# Patient Record
Sex: Male | Born: 1950 | Race: White | Hispanic: No | Marital: Married | Smoking: Former smoker
Health system: Southern US, Community
[De-identification: ages and names within clinical notes are randomized; demographics above are authoritative.]

## PROBLEM LIST (undated history)

## (undated) DIAGNOSIS — M51369 Other intervertebral disc degeneration, lumbar region without mention of lumbar back pain or lower extremity pain: Secondary | ICD-10-CM

## (undated) DIAGNOSIS — M5136 Other intervertebral disc degeneration, lumbar region: Secondary | ICD-10-CM

## (undated) DIAGNOSIS — K449 Diaphragmatic hernia without obstruction or gangrene: Secondary | ICD-10-CM

## (undated) DIAGNOSIS — I10 Essential (primary) hypertension: Secondary | ICD-10-CM

## (undated) DIAGNOSIS — E119 Type 2 diabetes mellitus without complications: Secondary | ICD-10-CM

## (undated) HISTORY — PX: WISDOM TOOTH EXTRACTION: SHX21

## (undated) HISTORY — DX: Other intervertebral disc degeneration, lumbar region: M51.36

## (undated) HISTORY — DX: Other intervertebral disc degeneration, lumbar region without mention of lumbar back pain or lower extremity pain: M51.369

---

## 2006-01-16 ENCOUNTER — Ambulatory Visit: Payer: Self-pay | Admitting: General Surgery

## 2006-01-16 HISTORY — PX: COLONOSCOPY: SHX174

## 2011-04-22 ENCOUNTER — Ambulatory Visit: Payer: Self-pay | Admitting: Internal Medicine

## 2016-11-08 ENCOUNTER — Ambulatory Visit: Payer: Medicare Other

## 2016-11-08 ENCOUNTER — Ambulatory Visit
Admission: EM | Admit: 2016-11-08 | Discharge: 2016-11-08 | Disposition: A | Discharge status: Home / Self Care | Payer: Medicare Other | Attending: Family Medicine | Admitting: Family Medicine

## 2016-11-08 DIAGNOSIS — M546 Pain in thoracic spine: Secondary | ICD-10-CM | POA: Diagnosis not present

## 2016-11-08 DIAGNOSIS — S29019A Strain of muscle and tendon of unspecified wall of thorax, initial encounter: Secondary | ICD-10-CM | POA: Diagnosis not present

## 2016-11-08 DIAGNOSIS — S161XXA Strain of muscle, fascia and tendon at neck level, initial encounter: Secondary | ICD-10-CM | POA: Diagnosis not present

## 2016-11-08 HISTORY — DX: Diaphragmatic hernia without obstruction or gangrene: K44.9

## 2016-11-08 HISTORY — DX: Type 2 diabetes mellitus without complications: E11.9

## 2016-11-08 MED ORDER — NAPROXEN 500 MG PO TABS: 500.0000 mg | ORAL_TABLET | Freq: Two times a day (BID) | ORAL | 0 refills | Status: AC

## 2016-11-08 MED ORDER — METAXALONE 800 MG PO TABS: 800.0000 mg | ORAL_TABLET | Freq: Three times a day (TID) | ORAL | 0 refills | Status: DC

## 2016-11-08 MED ORDER — KETOROLAC TROMETHAMINE 60 MG/2ML IM SOLN
30.0000 mg | Freq: Once | INTRAMUSCULAR | Status: AC
  Administered 2016-11-08: 30 mg via INTRAMUSCULAR
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
## 2018-05-01 ENCOUNTER — Encounter: Payer: Self-pay | Admitting: *Deleted

## 2018-05-31 ENCOUNTER — Ambulatory Visit: Payer: Self-pay | Admitting: General Surgery

## 2018-06-14 ENCOUNTER — Ambulatory Visit (INDEPENDENT_AMBULATORY_CARE_PROVIDER_SITE_OTHER): Payer: Medicare Other | Admitting: General Surgery

## 2018-06-14 ENCOUNTER — Encounter: Payer: Self-pay | Admitting: General Surgery

## 2018-06-14 VITALS — BP 140/80 | HR 67 | Resp 13 | Ht 70.0 in | Wt 191.0 lb

## 2018-06-14 DIAGNOSIS — Z1211 Encounter for screening for malignant neoplasm of colon: Secondary | ICD-10-CM

## 2018-06-14 MED ORDER — POLYETHYLENE GLYCOL 3350 17 GM/SCOOP PO POWD: ORAL | 0 refills | Status: DC

## 2018-06-14 NOTE — Patient Instructions (Addendum)
The patient is aware to call back for any questions or concerns.  Colonoscopy, Adult A colonoscopy is an exam to look at the entire large intestine. During the exam, a lubricated, bendable tube is inserted into the anus and then passed into the rectum, colon, and other parts of the large intestine. A colonoscopy is often done as a part of normal colorectal screening or in response to certain symptoms, such as anemia, persistent diarrhea, abdominal pain, and blood in the stool. The exam can help screen for and diagnose medical problems, including:  Tumors.  Polyps.  Inflammation.  Areas of bleeding.  Tell a health care provider about:  Any allergies you have.  All medicines you are taking, including vitamins, herbs, eye drops, creams, and over-the-counter medicines.  Any problems you or family members have had with anesthetic medicines.  Any blood disorders you have.  Any surgeries you have had.  Any medical conditions you have.  Any problems you have had passing stool. What are the risks? Generally, this is a safe procedure. However, problems may occur, including:  Bleeding.  A tear in the intestine.  A reaction to medicines given during the exam.  Infection (rare).  What happens before the procedure? Eating and drinking restrictions Follow instructions from your health care provider about eating and drinking, which may include:  A few days before the procedure - follow a low-fiber diet. Avoid nuts, seeds, dried fruit, raw fruits, and vegetables.  1-3 days before the procedure - follow a clear liquid diet. Drink only clear liquids, such as clear broth or bouillon, black coffee or tea, clear juice, clear soft drinks or sports drinks, gelatin dessert, and popsicles. Avoid any liquids that contain red or purple dye.  On the day of the procedure - do not eat or drink anything during the 2 hours before the procedure, or within the time period that your health care provider  recommends.  Bowel prep If you were prescribed an oral bowel prep to clean out your colon:  Take it as told by your health care provider. Starting the day before your procedure, you will need to drink a large amount of medicated liquid. The liquid will cause you to have multiple loose stools until your stool is almost clear or light green.  If your skin or anus gets irritated from diarrhea, you may use these to relieve the irritation: ? Medicated wipes, such as adult wet wipes with aloe and vitamin E. ? A skin soothing-product like petroleum jelly.  If you vomit while drinking the bowel prep, take a break for up to 60 minutes and then begin the bowel prep again. If vomiting continues and you cannot take the bowel prep without vomiting, call your health care provider.  General instructions  Ask your health care provider about changing or stopping your regular medicines. This is especially important if you are taking diabetes medicines or blood thinners.  Plan to have someone take you home from the hospital or clinic. What happens during the procedure?  An IV tube may be inserted into one of your veins.  You will be given medicine to help you relax (sedative).  To reduce your risk of infection: ? Your health care team will wash or sanitize their hands. ? Your anal area will be washed with soap.  You will be asked to lie on your side with your knees bent.  Your health care provider will lubricate a long, thin, flexible tube. The tube will have a camera and   a light on the end.  The tube will be inserted into your anus.  The tube will be gently eased through your rectum and colon.  Air will be delivered into your colon to keep it open. You may feel some pressure or cramping.  The camera will be used to take images during the procedure.  A small tissue sample may be removed from your body to be examined under a microscope (biopsy). If any potential problems are found, the tissue  will be sent to a lab for testing.  If small polyps are found, your health care provider may remove them and have them checked for cancer cells.  The tube that was inserted into your anus will be slowly removed. The procedure may vary among health care providers and hospitals. What happens after the procedure?  Your blood pressure, heart rate, breathing rate, and blood oxygen level will be monitored until the medicines you were given have worn off.  Do not drive for 24 hours after the exam.  You may have a small amount of blood in your stool.  You may pass gas and have mild abdominal cramping or bloating due to the air that was used to inflate your colon during the exam.  It is up to you to get the results of your procedure. Ask your health care provider, or the department performing the procedure, when your results will be ready. This information is not intended to replace advice given to you by your health care provider. Make sure you discuss any questions you have with your health care provider. Document Released: 09/09/2000 Document Revised: 07/13/2016 Document Reviewed: 11/24/2015 Elsevier Interactive Patient Education  2018 Elsevier Inc.  

## 2018-06-14 NOTE — Progress Notes (Signed)
Patient ID: Benjamin Dunn, male   DOB: 1951/06/01, 67 y.o.   MRN: [id number]  Chief Complaint  Patient presents with  . Colonoscopy    HPI Benjamin Dunn is a 67 y.o. male.  Who presents for a colonoscopy discussion referred by Dr Arlana Pouch. The last colonoscopy was completed on 01-16-06. Denies any gastrointestinal issues. Bowels move regular and no bleeding noted. He would like a large abdominal bulge checked out that he has noticed over time. He works as a Product/process development scientist.  HPI  Past Medical History:  Diagnosis Date  . Degenerative disc disease, lumbar   . Diabetes mellitus without complication (HCC)   . Hiatal hernia     Past Surgical History:  Procedure Laterality Date  . COLONOSCOPY  01/16/2006   Dr Lemar Livings  . WISDOM TOOTH EXTRACTION      Family History  Problem Relation Age of Onset  . Diabetes Mother   . Heart attack Father   . Diabetes Father   . Mesothelioma Father   . Colon cancer Neg Hx     Social History Social History   Tobacco Use  . Smoking status: Former Smoker    Last attempt to quit: 11/08/1986    Years since quitting: 31.6  . Smokeless tobacco: Never Used  Substance Use Topics  . Alcohol use: Yes    Comment: occasionally  . Drug use: No    No Known Allergies  Current Outpatient Medications  Medication Sig Dispense Refill  . clonazePAM (KLONOPIN) 0.5 MG tablet Take 0.5 mg by mouth 2 (two) times daily as needed for anxiety.    Marland Kitchen co-enzyme Q-10 30 MG capsule Take 30 mg by mouth daily.    Marland Kitchen glimepiride (AMARYL) 4 MG tablet Take 4 mg by mouth daily with breakfast.    . HYDROcodone-acetaminophen (NORCO/VICODIN) 5-325 MG tablet Take 1 tablet by mouth every 6 (six) hours as needed for moderate pain.    . Insulin Glargine (BASAGLAR KWIKPEN) 100 UNIT/ML SOPN Inject 38 Units into the skin daily.    Marland Kitchen lisinopril (PRINIVIL,ZESTRIL) 2.5 MG tablet Take 2.5 mg by mouth daily.     . metFORMIN (GLUCOPHAGE) 1000 MG tablet Take 1,000 mg by mouth 2 (two)  times daily with a meal.    . methocarbamol (ROBAXIN) 500 MG tablet Take 500 mg by mouth every 6 (six) hours as needed.     . Multiple Vitamins-Minerals (CENTRUM SILVER PO) Take by mouth daily.    . naproxen (NAPROSYN) 500 MG tablet Take 1 tablet (500 mg total) by mouth 2 (two) times daily with a meal. (Patient taking differently: Take 500 mg by mouth daily. ) 60 tablet 0  . Omega-3 Fatty Acids (FISH OIL) 1000 MG CAPS Take by mouth daily.    Marland Kitchen omeprazole (PRILOSEC) 20 MG capsule Take 20 mg by mouth daily.  4  . rosuvastatin (CRESTOR) 20 MG tablet TAKE 1 TABLET BY MOUTH IN THE EVENING     No current facility-administered medications for this visit.     Review of Systems Review of Systems  Constitutional: Negative.   Respiratory: Negative.   Cardiovascular: Negative.   Gastrointestinal: Negative for constipation and diarrhea.    Blood pressure 140/80, pulse 67, resp. rate 13, height 5\' 10"  (1.778 m), weight 191 lb (86.6 kg).  Physical Exam Physical Exam  Constitutional: He is oriented to person, place, and time. He appears well-developed and well-nourished.  HENT:  Mouth/Throat: No oropharyngeal exudate.  Eyes: Conjunctivae are normal. No scleral icterus.  Neck:  Neck supple.  Cardiovascular: Normal rate, regular rhythm and normal heart sounds.  Pulmonary/Chest: Effort normal and breath sounds normal.  Abdominal: Soft. Bowel sounds are normal.    diastasis recti   Neurological: He is alert and oriented to person, place, and time.  Skin: Skin is warm and dry.  Psychiatric: His behavior is normal.    Data Reviewed 2007 colonoscopy reviewed.  Normal study.  Assessment    Candidate for screening colonoscopy.    Plan    Colonoscopy with possible biopsy/polypectomy prn: Information regarding the procedure, including its potential risks and complications (including but not limited to perforation of the bowel, which may require emergency surgery to repair, and bleeding) was  verbally given to the patient. Educational information regarding lower intestinal endoscopy was given to the patient. Written instructions for how to complete the bowel prep using Miralax were provided. The importance of drinking ample fluids to avoid dehydration as a result of the prep emphasized.  Plastic surgery referral for consideration of diastases repair offered, would defer until after colonoscopy is completed.    HPI, Physical Exam, Assessment and Plan have been scribed under the direction and in the presence of Earline MayotteJeffrey W. Byrnett, MD. Dorathy DaftMarsha Hatch, RN  I have completed the exam and reviewed the above documentation for accuracy and completeness.  I agree with the above.  Museum/gallery conserv[employer] has been used and any errors in dictation or transcription are unintentional.  Donnalee CurryJeffrey Byrnett, M.D., F.A.C.S.  Merrily PewJeffrey W Byrnett 06/14/2018, 9:49 AM  Patient has been scheduled for a [facility]. Miralax prescription has been sent in to the patient's pharmacy today. Colonoscopy instructions have been reviewed with the patient. Patient has been asked to make the following diabetic medications adjustments: hold metformin day of prep and procedure, continue glimepiride day of prep, decrease insulin by half night before prep and procedure. This patient has been asked to discontinue fish oil one week prior to procedure. This patient is aware to call the office if they have further questions.   Nicholes MangoMichele J. Bailey, CMA

## 2018-07-04 ENCOUNTER — Encounter: Payer: Self-pay | Admitting: *Deleted

## 2018-07-04 ENCOUNTER — Ambulatory Visit
Admission: RE | Admit: 2018-07-04 | Discharge: 2018-07-04 | Disposition: A | Discharge status: Home / Self Care | Payer: Medicare Other | Source: Ambulatory Visit | Attending: General Surgery | Admitting: General Surgery

## 2018-07-04 ENCOUNTER — Ambulatory Visit: Payer: Medicare Other | Admitting: Anesthesiology

## 2018-07-04 ENCOUNTER — Other Ambulatory Visit: Payer: Self-pay

## 2018-07-04 ENCOUNTER — Telehealth: Payer: Self-pay | Admitting: *Deleted

## 2018-07-04 ENCOUNTER — Ambulatory Visit: Payer: Medicare Other | Admitting: General Surgery

## 2018-07-04 ENCOUNTER — Other Ambulatory Visit: Payer: Self-pay | Admitting: General Surgery

## 2018-07-04 ENCOUNTER — Encounter
Admission: RE | Disposition: A | Discharge status: Home / Self Care | Payer: Self-pay | Source: Ambulatory Visit | Attending: General Surgery

## 2018-07-04 ENCOUNTER — Observation Stay
Admission: AD | Admit: 2018-07-04 | Discharge: 2018-07-05 | Disposition: A | Discharge status: Home / Self Care | Payer: Medicare Other | Source: Ambulatory Visit | Attending: General Surgery | Admitting: General Surgery

## 2018-07-04 DIAGNOSIS — R509 Fever, unspecified: Secondary | ICD-10-CM | POA: Diagnosis not present

## 2018-07-04 DIAGNOSIS — K5730 Diverticulosis of large intestine without perforation or abscess without bleeding: Secondary | ICD-10-CM | POA: Diagnosis not present

## 2018-07-04 DIAGNOSIS — R112 Nausea with vomiting, unspecified: Secondary | ICD-10-CM | POA: Insufficient documentation

## 2018-07-04 DIAGNOSIS — Z79899 Other long term (current) drug therapy: Secondary | ICD-10-CM | POA: Insufficient documentation

## 2018-07-04 DIAGNOSIS — Z1211 Encounter for screening for malignant neoplasm of colon: Principal | ICD-10-CM | POA: Insufficient documentation

## 2018-07-04 DIAGNOSIS — J920 Pleural plaque with presence of asbestos: Secondary | ICD-10-CM

## 2018-07-04 DIAGNOSIS — Z8249 Family history of ischemic heart disease and other diseases of the circulatory system: Secondary | ICD-10-CM | POA: Diagnosis not present

## 2018-07-04 DIAGNOSIS — Z87891 Personal history of nicotine dependence: Secondary | ICD-10-CM | POA: Insufficient documentation

## 2018-07-04 DIAGNOSIS — R079 Chest pain, unspecified: Secondary | ICD-10-CM | POA: Diagnosis not present

## 2018-07-04 DIAGNOSIS — I959 Hypotension, unspecified: Secondary | ICD-10-CM | POA: Diagnosis not present

## 2018-07-04 DIAGNOSIS — E1165 Type 2 diabetes mellitus with hyperglycemia: Secondary | ICD-10-CM | POA: Diagnosis not present

## 2018-07-04 DIAGNOSIS — M791 Myalgia, unspecified site: Secondary | ICD-10-CM | POA: Insufficient documentation

## 2018-07-04 DIAGNOSIS — D72829 Elevated white blood cell count, unspecified: Secondary | ICD-10-CM | POA: Diagnosis not present

## 2018-07-04 DIAGNOSIS — G8918 Other acute postprocedural pain: Secondary | ICD-10-CM

## 2018-07-04 DIAGNOSIS — M1990 Unspecified osteoarthritis, unspecified site: Secondary | ICD-10-CM | POA: Diagnosis not present

## 2018-07-04 DIAGNOSIS — M5136 Other intervertebral disc degeneration, lumbar region: Secondary | ICD-10-CM | POA: Insufficient documentation

## 2018-07-04 DIAGNOSIS — J9589 Other postprocedural complications and disorders of respiratory system, not elsewhere classified: Secondary | ICD-10-CM

## 2018-07-04 DIAGNOSIS — Z794 Long term (current) use of insulin: Secondary | ICD-10-CM | POA: Insufficient documentation

## 2018-07-04 HISTORY — PX: COLONOSCOPY WITH PROPOFOL: SHX5780

## 2018-07-04 LAB — CBC WITH DIFFERENTIAL/PLATELET
ABS IMMATURE GRANULOCYTES: 0.02 10*3/uL (ref 0.00–0.07)
Basophils Absolute: 0.1 10*3/uL (ref 0.0–0.1)
Basophils Relative: 1 %
Eosinophils Absolute: 0 10*3/uL (ref 0.0–0.5)
Eosinophils Relative: 0 %
HCT: 43.3 % (ref 39.0–52.0)
HEMOGLOBIN: 14.7 g/dL (ref 13.0–17.0)
Immature Granulocytes: 0 %
LYMPHS PCT: 5 %
Lymphs Abs: 0.6 10*3/uL — ABNORMAL LOW (ref 0.7–4.0)
MCH: 32.8 pg (ref 26.0–34.0)
MCHC: 33.9 g/dL (ref 30.0–36.0)
MCV: 96.7 fL (ref 80.0–100.0)
MONO ABS: 0.4 10*3/uL (ref 0.1–1.0)
MONOS PCT: 3 %
Neutro Abs: 10.1 10*3/uL — ABNORMAL HIGH (ref 1.7–7.7)
Neutrophils Relative %: 91 %
Platelets: 319 10*3/uL (ref 150–400)
RBC: 4.48 MIL/uL (ref 4.22–5.81)
RDW: 12.8 % (ref 11.5–15.5)
WBC: 11.2 10*3/uL — ABNORMAL HIGH (ref 4.0–10.5)
nRBC: 0 % (ref 0.0–0.2)

## 2018-07-04 LAB — BASIC METABOLIC PANEL
ANION GAP: 9 (ref 5–15)
BUN: 19 mg/dL (ref 8–23)
CALCIUM: 9.1 mg/dL (ref 8.9–10.3)
CO2: 24 mmol/L (ref 22–32)
Chloride: 104 mmol/L (ref 98–111)
Creatinine, Ser: 1.18 mg/dL (ref 0.61–1.24)
GFR calc Af Amer: >60 mL/min (ref >60)
Glucose, Bld: 213 mg/dL — ABNORMAL HIGH (ref 70–99)
POTASSIUM: 4.3 mmol/L (ref 3.5–5.1)
SODIUM: 137 mmol/L (ref 135–145)

## 2018-07-04 LAB — GLUCOSE, CAPILLARY
GLUCOSE-CAPILLARY: 136 mg/dL — AB (ref 70–99)
Glucose-Capillary: 218 mg/dL — ABNORMAL HIGH (ref 70–99)
Glucose-Capillary: 164 mg/dL — ABNORMAL HIGH (ref 70–99)

## 2018-07-04 SURGERY — COLONOSCOPY WITH PROPOFOL
Anesthesia: General

## 2018-07-04 MED ORDER — CLONAZEPAM 0.5 MG PO TABS
0.5000 mg | ORAL_TABLET | Freq: Every day | ORAL | Status: DC
  Administered 2018-07-04: 0.5 mg via ORAL
  Filled 2018-07-04: qty 1

## 2018-07-04 MED ORDER — INSULIN ASPART 100 UNIT/ML ~~LOC~~ SOLN
0.0000 [IU] | Freq: Three times a day (TID) | SUBCUTANEOUS | Status: DC
  Administered 2018-07-05 (×2): 2 [IU] via SUBCUTANEOUS
  Filled 2018-07-04 (×2): qty 1

## 2018-07-04 MED ORDER — LACTATED RINGERS IV SOLN: Freq: Once | INTRAVENOUS | Status: DC

## 2018-07-04 MED ORDER — LACTATED RINGERS IV SOLN
INTRAVENOUS | Status: DC
  Administered 2018-07-04 – 2018-07-05 (×3): via INTRAVENOUS

## 2018-07-04 MED ORDER — DEXTROSE IN LACTATED RINGERS 5 % IV SOLN: INTRAVENOUS | Status: DC

## 2018-07-04 MED ORDER — PANTOPRAZOLE SODIUM 40 MG PO TBEC
40.0000 mg | DELAYED_RELEASE_TABLET | Freq: Every day | ORAL | Status: DC
  Administered 2018-07-04: 40 mg via ORAL
  Filled 2018-07-04 (×2): qty 1

## 2018-07-04 MED ORDER — PROPOFOL 10 MG/ML IV BOLUS
INTRAVENOUS | Status: DC | PRN
  Administered 2018-07-04: 20 mg via INTRAVENOUS
  Administered 2018-07-04: 50 mg via INTRAVENOUS

## 2018-07-04 MED ORDER — SODIUM CHLORIDE 0.9 % IV SOLN: 4.0000 mg | INTRAVENOUS | Status: DC | PRN

## 2018-07-04 MED ORDER — PROPOFOL 500 MG/50ML IV EMUL
INTRAVENOUS | Status: AC
  Filled 2018-07-04: qty 50

## 2018-07-04 MED ORDER — SODIUM CHLORIDE 0.9 % IV SOLN
1.0000 g | Freq: Once | INTRAVENOUS | Status: AC
  Administered 2018-07-04: 1000 mg via INTRAVENOUS
  Filled 2018-07-04: qty 1

## 2018-07-04 MED ORDER — ERTAPENEM SODIUM 1 G IJ SOLR: 1.0000 g | Freq: Once | INTRAMUSCULAR | Status: DC

## 2018-07-04 MED ORDER — LIDOCAINE HCL (PF) 2 % IJ SOLN
INTRAMUSCULAR | Status: DC | PRN
  Administered 2018-07-04: 80 mg via INTRADERMAL

## 2018-07-04 MED ORDER — ACETAMINOPHEN 325 MG PO TABS
650.0000 mg | ORAL_TABLET | ORAL | Status: DC | PRN
  Administered 2018-07-04 – 2018-07-05 (×2): 650 mg via ORAL
  Filled 2018-07-04 (×2): qty 2

## 2018-07-04 MED ORDER — PROPOFOL 10 MG/ML IV BOLUS
INTRAVENOUS | Status: AC
  Filled 2018-07-04: qty 20

## 2018-07-04 MED ORDER — PROPOFOL 500 MG/50ML IV EMUL
INTRAVENOUS | Status: DC | PRN
  Administered 2018-07-04: 125 ug/kg/min via INTRAVENOUS

## 2018-07-04 MED ORDER — MORPHINE SULFATE (PF) 2 MG/ML IV SOLN: 2.0000 mg | INTRAVENOUS | Status: DC | PRN

## 2018-07-04 MED ORDER — MORPHINE SULFATE (PF) 4 MG/ML IV SOLN: 2.0000 mg | INTRAVENOUS | Status: DC | PRN

## 2018-07-04 MED ORDER — ACETAMINOPHEN 10 MG/ML IV SOLN
1000.0000 mg | Freq: Once | INTRAVENOUS | Status: AC
  Administered 2018-07-04: 1000 mg via INTRAVENOUS
  Filled 2018-07-04: qty 100

## 2018-07-04 MED ORDER — ONDANSETRON HCL 4 MG/2ML IJ SOLN
4.0000 mg | INTRAMUSCULAR | Status: DC | PRN
  Administered 2018-07-04: 4 mg via INTRAVENOUS
  Filled 2018-07-04: qty 2

## 2018-07-04 MED ORDER — SODIUM CHLORIDE 0.9 % IV SOLN
INTRAVENOUS | Status: DC
  Administered 2018-07-04: 10:00:00 via INTRAVENOUS

## 2018-07-04 NOTE — Anesthesia Preprocedure Evaluation (Addendum)
Anesthesia Evaluation  Patient identified by MRN, date of birth, ID band Patient awake    Reviewed: Allergy & Precautions, H&P , NPO status , Patient's Chart, lab work & pertinent test results  Airway Mallampati: I  TM Distance: >3 FB Neck ROM: full    Dental  (+) Teeth Intact   Pulmonary former smoker,    breath sounds clear to auscultation       Cardiovascular negative cardio ROS   Rhythm:regular Rate:Normal     Neuro/Psych negative neurological ROS  negative psych ROS   GI/Hepatic Neg liver ROS, hiatal hernia,   Endo/Other  diabetes  Renal/GU negative Renal ROS  negative genitourinary   Musculoskeletal  (+) Arthritis ,   Abdominal   Peds  Hematology negative hematology ROS (+)   Anesthesia Other Findings Past Medical History: No date: Degenerative disc disease, lumbar No date: Diabetes mellitus without complication (HCC) No date: Hiatal hernia  Past Surgical History: 01/16/2006: COLONOSCOPY     Comment:  Dr Lemar Livings No date: WISDOM TOOTH EXTRACTION     Reproductive/Obstetrics negative OB ROS                            Anesthesia Physical Anesthesia Plan  ASA: II  Anesthesia Plan: General   Post-op Pain Management:    Induction:   PONV Risk Score and Plan: Propofol infusion and TIVA  Airway Management Planned:   Additional Equipment:   Intra-op Plan:   Post-operative Plan:   Informed Consent: I have reviewed the patients History and Physical, chart, labs and discussed the procedure including the risks, benefits and alternatives for the proposed anesthesia with the patient or authorized representative who has indicated his/her understanding and acceptance.   Dental Advisory Given  Plan Discussed with: Anesthesiologist, CRNA and Surgeon  Anesthesia Plan Comments:         Anesthesia Quick Evaluation

## 2018-07-04 NOTE — Anesthesia Post-op Follow-up Note (Signed)
Anesthesia QCDR form completed.        

## 2018-07-04 NOTE — H&P (Signed)
No change in clinical history or exam.  Tolerated prep well.  For screening colonoscopy. 

## 2018-07-04 NOTE — Op Note (Signed)
[facility] Patient Name: Benjamin Dunn Procedure Date: 07/04/2018 10:41 AM MRN: [id number] Account #: [id number] Date of Birth: Apr 13, 1951 Admit Type: Outpatient Age: 67 Room: [facility] ENDO ROOM 1 Gender: Male Note Status: Finalized Procedure:            Colonoscopy Indications:          Screening for colorectal malignant neoplasm Providers:            Earline Mayotte, MD Referring MD:         Jillene Bucks. Arlana Pouch, MD (Referring MD) Medicines:            Monitored Anesthesia Care Complications:        No immediate complications. Procedure:            Pre-Anesthesia Assessment:                       - Prior to the procedure, a History and Physical was                        performed, and patient medications, allergies and                        sensitivities were reviewed. The patient's tolerance of                        previous anesthesia was reviewed.                       - The risks and benefits of the procedure and the                        sedation options and risks were discussed with the                        patient. All questions were answered and informed                        consent was obtained.                       After obtaining informed consent, the colonoscope was                        passed under direct vision. Throughout the procedure,                        the patient's blood pressure, pulse, and oxygen                        saturations were monitored continuously. The                        Colonoscope was introduced through the anus and                        advanced to the the cecum, identified by appendiceal                        orifice and ileocecal valve. The patient tolerated the  procedure well. The colonoscopy was somewhat difficult                        due to a tortuous colon. Successful completion of the                        procedure was aided by using manual pressure. The                 quality of the bowel preparation was excellent. Findings:      A single medium-mouthed diverticulum was found in the sigmoid colon.      The retroflexed view of the distal rectum and anal verge was normal and       showed no anal or rectal abnormalities. Impression:           - Diverticulosis in the sigmoid colon.                       - The distal rectum and anal verge are normal on                        retroflexion view.                       - No specimens collected. Recommendation:       - Repeat colonoscopy in 10 years for screening purposes. Procedure Code(s):    --- Professional ---                       [id number], Colonoscopy, flexible; diagnostic, including                        collection of specimen(s) by brushing or washing, when                        performed (separate procedure) Diagnosis Code(s):    --- Professional ---                       Z12.11, Encounter for screening for malignant neoplasm                        of colon                       K57.30, Diverticulosis of large intestine without                        perforation or abscess without bleeding CPT copyright 2018 American Medical Association. All rights reserved. The codes documented in this report are preliminary and upon coder review may  be revised to meet current compliance requirements. Earline Mayotte, MD 07/04/2018 11:24:44 AM This report has been signed electronically. Number of Addenda: 0 Note Initiated On: 07/04/2018 10:41 AM Scope Withdrawal Time: 0 hours 14 minutes 1 second  Total Procedure Duration: 0 hours 22 minutes 20 seconds       [facility]

## 2018-07-04 NOTE — Progress Notes (Signed)
Patient called reported chills, weakness 3 hours post colonoscopy.  Presented outpatient imaging for plain film and nearly collapsed. Admitted for management. Suspect perforation. (No biopsies completed during procedure).

## 2018-07-04 NOTE — Progress Notes (Signed)
Labs reviewed. Mild leukocytosis with left shift.  Will f/u in AM.  Lytes, renal function OK. Modest hyperglycemia.

## 2018-07-04 NOTE — H&P (Signed)
Benjamin Dunn is an 67 y.o. male.   Chief Complaint: Fever, chills, nausea and vomiting post colonoscopy HPI: This 67 year old male underwent a screening colonoscopy earlier today.  No biopsies were completed during the procedure and it was uneventful.  It did require full scope insertion and manual pressure in the sigmoid colon to negotiate the hepatic flexure.  The patient reported no symptoms at the time of discharge.  About 2-1/2 hours after leaving the hospital, after having lunch, he reported feeling slightly unwell with a chill followed by muscle aches.  Over the next hour or 2 this worsened and when he contacted the office we arrange for him to have plain films of the abdomen to look for occult perforation.  He was sent to the [facility] imaging center and became weak over there and required being helped to a chair by the technologist.  He is admitted now for further management.  Past Medical History:  Diagnosis Date  . Degenerative disc disease, lumbar   . Diabetes mellitus without complication (HCC)   . Hiatal hernia     Past Surgical History:  Procedure Laterality Date  . COLONOSCOPY  01/16/2006   Dr Lemar Livings  . WISDOM TOOTH EXTRACTION      Family History  Problem Relation Age of Onset  . Diabetes Mother   . Heart attack Father   . Diabetes Father   . Mesothelioma Father   . Colon cancer Neg Hx    Social History:  reports that he quit smoking about 31 years ago. He has never used smokeless tobacco. He reports that he drinks alcohol. He reports that he does not use drugs.  Allergies: No Known Allergies  Facility-Administered Medications Prior to Admission  Medication Dose Route Frequency Provider Last Rate Last Dose  . dextrose 5 % in lactated ringers infusion   Intravenous Continuous Osman Calzadilla, Merrily Pew, MD      . ertapenem [facility]) injection 1,000 mg  1 g Intravenous Once Josey Forcier, Merrily Pew, MD      . lactated ringers infusion   Intravenous Once Earline Mayotte,  MD      . morphine 4 MG/ML injection 2 mg  2 mg Intravenous Q2H PRN Earline Mayotte, MD      . ondansetron (ZOFRAN) 4 mg in sodium chloride 0.9 % 50 mL IVPB  4 mg Intravenous Q4H PRN Lemar Livings Merrily Pew, MD       Medications Prior to Admission  Medication Sig Dispense Refill  . clonazePAM (KLONOPIN) 0.5 MG tablet Take 0.5 mg by mouth 2 (two) times daily as needed for anxiety.    Marland Kitchen co-enzyme Q-10 30 MG capsule Take 30 mg by mouth daily.    Marland Kitchen glimepiride (AMARYL) 4 MG tablet Take 4 mg by mouth 2 (two) times daily.     Marland Kitchen HYDROcodone-acetaminophen (NORCO/VICODIN) 5-325 MG tablet Take 1 tablet by mouth every 6 (six) hours as needed for moderate pain.    . Insulin Glargine (BASAGLAR KWIKPEN) 100 UNIT/ML SOPN Inject 38 Units into the skin daily.    Marland Kitchen lisinopril (PRINIVIL,ZESTRIL) 2.5 MG tablet Take 2.5 mg by mouth daily.     . metFORMIN (GLUCOPHAGE) 1000 MG tablet Take 1,000 mg by mouth 2 (two) times daily with a meal.    . methocarbamol (ROBAXIN) 500 MG tablet Take 500 mg by mouth every 6 (six) hours as needed.     . Multiple Vitamins-Minerals (CENTRUM SILVER PO) Take by mouth daily.    . naproxen (NAPROSYN) 500 MG tablet Take  1 tablet (500 mg total) by mouth 2 (two) times daily with a meal. (Patient taking differently: Take 500 mg by mouth daily. ) 60 tablet 0  . Omega-3 Fatty Acids (FISH OIL) 1000 MG CAPS Take by mouth daily.    Marland Kitchen omeprazole (PRILOSEC) 20 MG capsule Take 20 mg by mouth daily.  4  . polyethylene glycol powder (GLYCOLAX/MIRALAX) powder 255 grams one bottle for colonoscopy prep 255 g 0  . rosuvastatin (CRESTOR) 20 MG tablet TAKE 1 TABLET BY MOUTH IN THE EVENING      Results for orders placed or performed during the hospital encounter of 07/04/18 (from the past 48 hour(s))  Glucose, capillary     Status: Abnormal   Collection Time: 07/04/18  5:20 PM  Result Value Ref Range   Glucose-Capillary 218 (H) 70 - 99 mg/dL   Comment 1 Notify RN    Dg Abd Acute W/chest  Result Date:  07/04/2018 CLINICAL DATA:  Chest pain, abdominal pain, nausea and vomiting after colonoscopy this morning. EXAM: DG ABDOMEN ACUTE W/ 1V CHEST COMPARISON:  None. FINDINGS: Normal heart size. Normal mediastinal contour. No pneumothorax. No pleural effusion. A few vague scattered nodular densities over the upper lungs bilaterally. Curvilinear calcifications overlie the left mid lung. Mild left basilar scarring versus atelectasis. No pulmonary edema. Relative absence of small bowel gas. Mild gas scattered throughout large bowel. No convincing pneumatosis or pneumoperitoneum. No radiopaque nephrolithiasis. Mild-to-moderate thoracolumbar spondylosis. IMPRESSION: 1. A few vague scattered nodular densities over the upper lungs bilaterally with curvilinear calcifications overlying the left mid lung. Differential includes pleural plaques and/or pulmonary nodules. Chest CT recommended for further characterization. 2. Mild left basilar scarring versus atelectasis. 3. Paucity of small bowel gas. Mild colonic gas. No convincing pneumatosis or pneumoperitoneum. Electronically Signed   By: Delbert Phenix M.D.   On: 07/04/2018 17:11  These films were independently reviewed.  I do not see evidence of free air.  ROS  Blood pressure 137/63, pulse (!) 101, temperature (!) 101.2 F (38.4 C), temperature source Oral, resp. rate 16, SpO2 97 %. Physical Exam  Constitutional: He is oriented to person, place, and time. He appears well-developed and well-nourished.  Eyes: Pupils are equal, round, and reactive to light.  Neck: Neck supple. No thyromegaly present.  Cardiovascular: Normal rate and regular rhythm.  Respiratory: Effort normal and breath sounds normal.  GI: Soft. Bowel sounds are normal. He exhibits distension (mild distension and tympany consistent w/ recent endoscopy. ).  Musculoskeletal: Normal range of motion.  Neurological: He is alert and oriented to person, place, and time.  Skin: Skin is warm and dry.   Psychiatric: He has a normal mood and affect. His behavior is normal. Judgment and thought content normal.     Assessment/Plan Possible microperforation versus transient bacteremia.  The patient will be given a dose of Invanz, receive IV fluids, medications for nausea and pain.  Clinical exam at this time does not warrant additional intervention.  Lung findings noted on the chest component of his abdominal series will be addressed with an outpatient CT in the future.  Earline Mayotte, MD 07/04/2018, 5:56 PM

## 2018-07-04 NOTE — Telephone Encounter (Signed)
He is post colonoscopy this am. Phone call from wife states he is having chills, cool and clammy, that started one hour ago. When he takes a deep breath his chest hurts. Body aches all over and is pale. Dr Lemar Livings aware and pt to have erect abd/kub at [facility] imaging and then come to his office, pt agrees.

## 2018-07-04 NOTE — Anesthesia Postprocedure Evaluation (Signed)
Anesthesia Post Note  Patient: Benjamin Dunn  Procedure(s) Performed: COLONOSCOPY WITH PROPOFOL (N/A )  Patient location during evaluation: PACU Anesthesia Type: General Level of consciousness: awake and alert Pain management: pain level controlled Vital Signs Assessment: post-procedure vital signs reviewed and stable Respiratory status: spontaneous breathing, nonlabored ventilation and respiratory function stable Cardiovascular status: blood pressure returned to baseline and stable Postop Assessment: no apparent nausea or vomiting Anesthetic complications: no     Last Vitals:  Vitals:   07/04/18 1145 07/04/18 1155  BP: (!) 148/76 (!) 142/77  Pulse:    Resp:    Temp:    SpO2:      Last Pain:  Vitals:   07/04/18 1155  TempSrc:   PainSc: 0-No pain                 Jovita Gamma

## 2018-07-04 NOTE — Transfer of Care (Signed)
Immediate Anesthesia Transfer of Care Note  Patient: Benjamin Dunn  Procedure(s) Performed: COLONOSCOPY WITH PROPOFOL (N/A )  Patient Location: PACU  Anesthesia Type:General  Level of Consciousness: sedated  Airway & Oxygen Therapy: Patient Spontanous Breathing and Patient connected to nasal cannula oxygen  Post-op Assessment: Report given to RN and Post -op Vital signs reviewed and stable  Post vital signs: Reviewed and stable  Last Vitals:  Vitals Value Taken Time  BP 118/75 07/04/2018 11:26 AM  Temp 36.4 C 07/04/2018 11:25 AM  Pulse 71 07/04/2018 11:26 AM  Resp 15 07/04/2018 11:26 AM  SpO2 97 % 07/04/2018 11:26 AM  Vitals shown include unvalidated device data.  Last Pain:  Vitals:   07/04/18 1125  TempSrc: Tympanic  PainSc: Asleep         Complications: No apparent anesthesia complications

## 2018-07-05 ENCOUNTER — Observation Stay: Payer: Medicare Other

## 2018-07-05 DIAGNOSIS — Z1211 Encounter for screening for malignant neoplasm of colon: Secondary | ICD-10-CM | POA: Diagnosis not present

## 2018-07-05 DIAGNOSIS — J920 Pleural plaque with presence of asbestos: Secondary | ICD-10-CM | POA: Diagnosis not present

## 2018-07-05 LAB — CBC WITH DIFFERENTIAL/PLATELET
Abs Immature Granulocytes: 0.04 10*3/uL (ref 0.00–0.07)
Basophils Absolute: 0.1 10*3/uL (ref 0.0–0.1)
Basophils Relative: 0 %
EOS ABS: 0 10*3/uL (ref 0.0–0.5)
EOS PCT: 0 %
HCT: 39.7 % (ref 39.0–52.0)
Hemoglobin: 13 g/dL (ref 13.0–17.0)
Immature Granulocytes: 0 %
Lymphocytes Relative: 10 %
Lymphs Abs: 1.5 10*3/uL (ref 0.7–4.0)
MCH: 31.7 pg (ref 26.0–34.0)
MCHC: 32.7 g/dL (ref 30.0–36.0)
MCV: 96.8 fL (ref 80.0–100.0)
MONO ABS: 0.7 10*3/uL (ref 0.1–1.0)
MONOS PCT: 5 %
NEUTROS PCT: 85 %
Neutro Abs: 11.8 10*3/uL — ABNORMAL HIGH (ref 1.7–7.7)
PLATELETS: 271 10*3/uL (ref 150–400)
RBC: 4.1 MIL/uL — ABNORMAL LOW (ref 4.22–5.81)
RDW: 12.7 % (ref 11.5–15.5)
WBC: 14.1 10*3/uL — ABNORMAL HIGH (ref 4.0–10.5)
nRBC: 0 % (ref 0.0–0.2)

## 2018-07-05 LAB — GLUCOSE, CAPILLARY
Glucose-Capillary: 150 mg/dL — ABNORMAL HIGH (ref 70–99)
Glucose-Capillary: 128 mg/dL — ABNORMAL HIGH (ref 70–99)

## 2018-07-05 LAB — HIV ANTIBODY (ROUTINE TESTING W REFLEX): HIV SCREEN 4TH GENERATION: NONREACTIVE

## 2018-07-05 MED ORDER — SODIUM CHLORIDE 0.9 % IV SOLN
1.0000 g | INTRAVENOUS | Status: DC
  Administered 2018-07-05: 1000 mg via INTRAVENOUS
  Filled 2018-07-05 (×2): qty 1

## 2018-07-05 MED ORDER — IOPAMIDOL (ISOVUE-300) INJECTION 61%
100.0000 mL | Freq: Once | INTRAVENOUS | Status: AC | PRN
  Administered 2018-07-05: 100 mL via INTRAVENOUS

## 2018-07-05 MED ORDER — IOPAMIDOL (ISOVUE-300) INJECTION 61%
15.0000 mL | INTRAVENOUS | Status: AC
  Administered 2018-07-05 (×2): 15 mL via ORAL

## 2018-07-05 NOTE — Care Management Obs Status (Signed)
MEDICARE OBSERVATION STATUS NOTIFICATION   Patient Details  Name: Benjamin Dunn MRN: [id number] Date of Birth: 08-Feb-1951   Medicare Observation Status Notification Given:  No(admitted obs less than 24 hours)    Chapman Fitch, RN 07/05/2018, 4:48 PM

## 2018-07-05 NOTE — Progress Notes (Signed)
Patient ID: Benjamin Dunn, male   DOB: Aug 22, 1951, 67 y.o.   MRN: [id number]  No chief complaint on file.   Referred By Dr. Donnalee Curry Reason for Referral pleural plaques  HPI Location, Quality, Duration, Severity, Timing, Context, Modifying Factors, Associated Signs and Symptoms.  Benjamin Dunn is a 67 y.o. male.  He was in his usual state of health until a routine colonoscopy prompted admission to the hospital for evaluation of an occult perforation.  He is a 67 year old gentleman who underwent a routine colonoscopy and did well with that.  Several hours after his colonoscopy he began to experience some abdominal pain and during a abdominal plain film series as an outpatient he experienced some syncopal episode and was admitted to the hospital for further evaluation.  A chest x-ray as part of the abdominal series reveals some pleural plaques and a subsequent CT scan of the chest was performed.  The CT scan of the chest has been independently reviewed by me.  There are no prior films for comparison.  The patient worked in [employer] for many years.  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  He also had a father who worked in the Chief of [employer] in [employer] and had a history of mesothelioma.  He ultimately died of mesothelioma with patient was a young child.  With respect to any chest symptoms he denies any recent chest wall pain.  He denies any fevers or chills.  He denies any cough or hemoptysis.  He does endorse a 5 pound weight loss but that is the only symptom he has had.  He states that he is always wondered if he will develop mesothelioma since his father had mesothelioma and he was exposed to asbestos.  He has a remote smoking history having quit approximately 30 years ago.   Past Medical History:  Diagnosis Date  . Degenerative disc disease, lumbar   .  Diabetes mellitus without complication (HCC)   . Hiatal hernia     Past Surgical History:  Procedure Laterality Date  . COLONOSCOPY  01/16/2006   Dr Lemar Livings  . WISDOM TOOTH EXTRACTION      Family History  Problem Relation Age of Onset  . Diabetes Mother   . Heart attack Father   . Diabetes Father   . Mesothelioma Father   . Colon cancer Neg Hx     Social History Social History   Tobacco Use  . Smoking status: Former Smoker    Last attempt to quit: 11/08/1986    Years since quitting: 31.6  . Smokeless tobacco: Never Used  Substance Use Topics  . Alcohol use: Yes    Comment: occasionally  . Drug use: No    No Known Allergies  Current Facility-Administered Medications  Medication Dose Route Frequency Provider Last Rate Last Dose  . acetaminophen (TYLENOL) tablet 650 mg  650 mg Oral Q4H PRN Earline Mayotte, MD   650 mg at 07/05/18 1027  . clonazePAM (KLONOPIN) tablet 0.5 mg  0.5 mg Oral QHS Earline Mayotte, MD   0.5 mg at 07/04/18 2321  . ertapenem (INVANZ) 1,000 mg in sodium chloride 0.9 % 100 mL IVPB  1 g Intravenous Q24H Earline Mayotte, MD 200 mL/hr at 07/05/18 1222 1,000 mg at 07/05/18 1222  . insulin aspart (novoLOG) injection 0-15 Units  0-15 Units Subcutaneous TID WC Byrnett, Merrily Pew, MD  2 Units at 07/05/18 1223  . lactated ringers infusion   Intravenous Continuous Earline Mayotte, MD 125 mL/hr at 07/05/18 1220    . morphine 2 MG/ML injection 2 mg  2 mg Intravenous Q2H PRN Earline Mayotte, MD      . ondansetron [facility]) injection 4 mg  4 mg Intravenous Q4H PRN Earline Mayotte, MD   4 mg at 07/04/18 1850  . pantoprazole (PROTONIX) EC tablet 40 mg  40 mg Oral Daily Earline Mayotte, MD   40 mg at 07/04/18 1849      Review of Systems A complete review of systems was asked and was negative except for the following positive findings 5 pound weight loss  Blood pressure 117/64, pulse 70, temperature 98.3 F (36.8 C), temperature source Oral,  resp. rate 16, SpO2 97 %.  Physical Exam CONSTITUTIONAL:  Pleasant, well-developed, well-nourished, and in no acute distress. EYES: Pupils equal and reactive to light, Sclera non-icteric EARS, NOSE, MOUTH AND THROAT:  The oropharynx was clear.  Dentition is good repair.  Oral mucosa pink and moist. LYMPH NODES:  Lymph nodes in the neck and axillae were normal RESPIRATORY:  Lungs were clear.  Normal respiratory effort without pathologic use of accessory muscles of respiration CARDIOVASCULAR: Heart was regular without murmurs.  There were no carotid bruits. GI: The abdomen was soft, nontender, and nondistended. There were no palpable masses. There was no hepatosplenomegaly. There were hyperactive bowel sounds in all quadrants. GU:  Rectal deferred.   MUSCULOSKELETAL:  Normal muscle strength and tone.  No clubbing or cyanosis.   SKIN:  There were no pathologic skin lesions.  There were no nodules on palpation. NEUROLOGIC:  Sensation is normal.  Cranial nerves are grossly intact. PSYCH:  Oriented to person, place and time.  Mood and affect are normal.  Data Reviewed CT scan  I have personally reviewed the patient's imaging, laboratory findings and medical records.    Assessment    I have independently reviewed the patient's CT scan.  There are bilateral pleural plaques the largest of which is in the left upper chest cavity.  There is some calcifications associated with these.  There is nothing to suggest invasion of the chest wall.  There are 2 small AP window lymph nodes measuring 8 to 9 mm.  There is one paraesophageal node on the right which measures 11 mm.  There are no lung nodules.    Plan    I had a long discussion with the patient his wife and his mother-in-law.  I explained to him that these plaques could be related to asbestos and since there are no prior x-rays for comparison we must continue to be diligent in following these.  A random pleural biopsy is unlikely to be helpful and  therefore the only options that we discussed would be a repeat CT scan in 3 months to evaluate these and a PET scan to see if any of these pleural plaques are markedly positive.  I gave the patient my business card and told him to contact me once they have had a chance to think about their options.  At the present time he was not sure what he would like to do.  I did go over the indications and the risks of these 2 diagnostic modalities including the need for surveillance.  He understands.  He will contact our office within the next few days to give [place] his guidance as to how he would like to proceed.  Hulda Marin, MD 07/05/2018, 2:56 PM

## 2018-07-05 NOTE — Progress Notes (Signed)
CT results reviewed. Have contacted Hulda Marin, MD from thoracic service to assess plaque formation noted on chest CT. ABD CT negative for source of fever, chills, etc. Will have patient receive on final dose of Invanz at 4 PM and then plan on discharge.  He tolerated lunch well.

## 2018-07-05 NOTE — Progress Notes (Addendum)
Afebrile since admission. BP down overnight. Pain free. Nausea resolved. No discomfort with deep breathing. Lungs: Clear, BS equal bilaterally. Cardio: RR. ABD: Mild distension,soft, normal BS. Labs: WBC up from 11.2 to 14.1 with less left shift.  HGB down 14.7 to 13 with hydration. BS trending down.  No prior CBC in system.  Exam looks like acute episode has resolved. Will get CT to assess lung nodules and look for occult bleed (capsular tear of spleen?) then start PO.

## 2018-07-06 ENCOUNTER — Encounter: Payer: Self-pay | Admitting: General Surgery

## 2018-07-09 ENCOUNTER — Encounter: Payer: Self-pay | Admitting: General Surgery

## 2018-07-09 NOTE — Discharge Summary (Signed)
Physician Discharge Summary  Patient ID: Benjamin Dunn MRN: [id number] DOB/AGE: [birth year] 67 y.o.  Admit date: 07/04/2018 Discharge date: 07/09/2018  Admission Diagnoses: Fever, chills, concern for colonic perforation.  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  He called reporting fever, chills and myalgias.  He underwent plain films of the abdomen that did not show evidence of perforation/free air.  He was admitted for suspected microperforation and IV antibiotics.  His symptoms resolved in approximately 6-8 hours.  Mild hypotension without tachycardia.  CT scan of the chest abdomen and pelvis obtained to evaluate findings on the chest x-ray and to confirm no evidence of abscess or capsular tear of the spleen as source of his abdominal symptoms were completed with findings of plaquing in the pleura.  Consults: Thoracic surgery  Significant Diagnostic Studies: radiology: CT scan: As noted above  Treatments: IV hydration and antibiotics: Invanz  Discharge Exam: Blood pressure 117/64, pulse 70, temperature 98.3 F (36.8 C), temperature source Oral, resp. rate 16, SpO2 97 %. General appearance: alert and cooperative Resp: clear to auscultation bilaterally Cardio: regular rate and rhythm, S1, S2 normal, no murmur, click, rub or gallop GI: soft, non-tender; bowel sounds normal; no masses,  no organomegaly  Disposition:   Discharge Instructions    Diet Carb Modified   Complete by:  As directed    Discharge instructions   Complete by:  As directed    Resume regular diet and medications.  Report if you have additional problems with fever or chills.  Decrease insulin by 50% today.   Increase activity slowly   Complete by:  As directed      Allergies as of 07/05/2018   No Known  Allergies     Medication List    STOP taking these medications   polyethylene glycol powder powder Commonly known as:  GLYCOLAX/MIRALAX     TAKE these medications   BASAGLAR KWIKPEN 100 UNIT/ML Sopn Inject 38 Units into the skin daily.   CENTRUM SILVER PO Take by mouth daily.   clonazePAM 0.5 MG tablet Commonly known as:  KLONOPIN Take 0.5 mg by mouth 2 (two) times daily as needed for anxiety.   co-enzyme Q-10 30 MG capsule Take 30 mg by mouth daily.   Fish Oil 1000 MG Caps Take by mouth daily.   glimepiride 4 MG tablet Commonly known as:  AMARYL Take 4 mg by mouth 2 (two) times daily.   HYDROcodone-acetaminophen 5-325 MG tablet Commonly known as:  NORCO/VICODIN Take 1 tablet by mouth every 6 (six) hours as needed for moderate pain.   lisinopril 2.5 MG tablet Commonly known as:  PRINIVIL,ZESTRIL Take 2.5 mg by mouth daily.   metFORMIN 1000 MG tablet Commonly known as:  GLUCOPHAGE Take 1,000 mg by mouth 2 (two) times daily with a meal.   methocarbamol 500 MG tablet Commonly known as:  ROBAXIN Take 500 mg by mouth every 6 (six) hours as needed.   naproxen 500 MG tablet Commonly known as:  NAPROSYN Take 1 tablet (500 mg total) by mouth 2 (two) times daily with a meal. What changed:  when to take this   omeprazole 20 MG capsule Commonly known as:  PRILOSEC Take 20 mg by mouth daily.   rosuvastatin 20 MG tablet Commonly known as:  CRESTOR TAKE 1 TABLET BY MOUTH IN THE EVENING  The patient was seen by Hulda Marin, MD of thoracic surgery.  CT findings reviewed.  Outpatient follow-up pending patient interest.    Signed: Earline Mayotte 07/09/2018, 8:55 AM

## 2018-07-13 ENCOUNTER — Ambulatory Visit (INDEPENDENT_AMBULATORY_CARE_PROVIDER_SITE_OTHER): Payer: Medicare Other | Admitting: Cardiothoracic Surgery

## 2018-07-13 ENCOUNTER — Encounter: Payer: Self-pay | Admitting: Cardiothoracic Surgery

## 2018-07-13 VITALS — BP 122/68 | HR 70 | Temp 97.7°F | Wt 190.0 lb

## 2018-07-13 DIAGNOSIS — J920 Pleural plaque with presence of asbestos: Secondary | ICD-10-CM

## 2018-07-13 NOTE — Progress Notes (Signed)
Patient ID: Benjamin Dunn, male   DOB: 15-Oct-1950, 67 y.o.   MRN: [id number]  Chief Complaint  Patient presents with  . New Patient (Initial Visit)    pleural plaques    Referred By Dr. Santa Genera Reason for Referral pleural plaques  HPI Location, Quality, Duration, Severity, Timing, Context, Modifying Factors, Associated Signs and Symptoms.  Benjamin Dunn is a 67 y.o. male.  Approximately 2 weeks ago he underwent a colonoscopy for routine surveillance and shortly thereafter experienced an acute episode of shortness of breath and severe abdominal pain.  During the process of that evaluation he was admitted to the hospital after undergoing CT scanning of the chest and abdomen.  The CT scan of the chest confirmed the presence of multiple pleural plaques mostly on the left side but certainly scattered throughout the right pleural space as well.  There were no pulmonary lesions identified.  After short stay in the hospital he was ultimately discharged home.  He states that he has been getting along reasonably well at home without any recurrence of his symptoms.  He does not complain of any chest pain or shortness of breath.  Patient does have a history of asbestos exposure.  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  This was approximately 30 to 40 years ago.  In addition his father developed mesothelioma from asbestos exposure at [employer] shipyards.  Therefore he has been concerned about these pleural plaques and would like my opinion in consultation.   Past Medical History:  Diagnosis Date  . Degenerative disc disease, lumbar   . Diabetes mellitus without complication (HCC)   . Hiatal hernia     Past Surgical History:  Procedure Laterality Date  . COLONOSCOPY  01/16/2006   Dr Lemar Livings  . COLONOSCOPY WITH PROPOFOL N/A 07/04/2018   Procedure: COLONOSCOPY WITH PROPOFOL;  Surgeon: Earline Mayotte, MD;  Location:  [facility] ENDOSCOPY;  Service: Endoscopy;  Laterality: N/A;  . WISDOM TOOTH EXTRACTION      Family History  Problem Relation Age of Onset  . Diabetes Mother   . Heart attack Father   . Diabetes Father   . Mesothelioma Father   . Colon cancer Neg Hx     Social History Social History   Tobacco Use  . Smoking status: Former Smoker    Packs/day: 3.00    Years: 12.00    Pack years: 36.00    Types: Cigarettes    Last attempt to quit: 11/08/1986    Years since quitting: 31.6  . Smokeless tobacco: Never Used  Substance Use Topics  . Alcohol use: Yes    Comment: occasionally  . Drug use: No    No Known Allergies  Current Outpatient Medications  Medication Sig Dispense Refill  . BD PEN NEEDLE NANO U/F 32G X 4 MM MISC USE 1 ONCE DAILY WITH BASAGLAR PEN  3  . clonazePAM (KLONOPIN) 0.5 MG tablet Take 0.5 mg by mouth 2 (two) times daily as needed for anxiety.    Marland Kitchen co-enzyme Q-10 30 MG capsule Take 30 mg by mouth daily.    Marland Kitchen glimepiride (AMARYL) 4 MG tablet Take 4 mg by mouth 2 (two) times daily.     Marland Kitchen HYDROcodone-acetaminophen (NORCO/VICODIN) 5-325 MG tablet Take 1 tablet by mouth every 6 (six) hours as needed for moderate pain.    . Insulin Glargine (BASAGLAR KWIKPEN) 100 UNIT/ML SOPN Inject 38 Units into the  skin daily.    Marland Kitchen lisinopril (PRINIVIL,ZESTRIL) 2.5 MG tablet Take 2.5 mg by mouth daily.     . metFORMIN (GLUCOPHAGE) 1000 MG tablet Take 1,000 mg by mouth 2 (two) times daily with a meal.    . methocarbamol (ROBAXIN) 500 MG tablet Take 500 mg by mouth every 6 (six) hours as needed.     . Multiple Vitamins-Minerals (CENTRUM SILVER PO) Take by mouth daily.    . naproxen (NAPROSYN) 500 MG tablet Take 1 tablet (500 mg total) by mouth 2 (two) times daily with a meal. (Patient taking differently: Take 500 mg by mouth daily. ) 60 tablet 0  . Omega-3 Fatty Acids (FISH OIL) 1000 MG CAPS Take by mouth daily.    Marland Kitchen omeprazole (PRILOSEC) 20 MG capsule Take 20 mg by mouth daily.  4  .  rosuvastatin (CRESTOR) 20 MG tablet TAKE 1 TABLET BY MOUTH IN THE EVENING     No current facility-administered medications for this visit.       Review of Systems A complete review of systems was asked and was negative except for the following positive findings reflux symptoms and joint pain.  Blood pressure 122/68, pulse 70, temperature 97.7 F (36.5 C), temperature source Skin, weight 190 lb (86.2 kg).  Physical Exam CONSTITUTIONAL:  Pleasant, well-developed, well-nourished, and in no acute distress. EYES: Pupils equal and reactive to light, Sclera non-icteric EARS, NOSE, MOUTH AND THROAT:  The oropharynx was clear.  Dentition is good repair.  Oral mucosa pink and moist. LYMPH NODES:  Lymph nodes in the neck and axillae were normal RESPIRATORY:  Lungs were clear.  Normal respiratory effort without pathologic use of accessory muscles of respiration CARDIOVASCULAR: Heart was regular without murmurs.  There were no carotid bruits. GI: The abdomen was soft, nontender, and nondistended. There were no palpable masses. There was no hepatosplenomegaly. There were normal bowel sounds in all quadrants. GU:  Rectal deferred.   MUSCULOSKELETAL:  Normal muscle strength and tone.  No clubbing or cyanosis.   SKIN:  There were no pathologic skin lesions.  There were no nodules on palpation. NEUROLOGIC:  Sensation is normal.  Cranial nerves are grossly intact. PSYCH:  Oriented to person, place and time.  Mood and affect are normal.  Data Reviewed CT scans  I have personally reviewed the patient's imaging, laboratory findings and medical records.    Assessment    There are multiple scattered pleural plaques throughout both right and left sides.  There is no evidence of any lung mass.    Plan    Because of his history of asbestos exposure and his family history of mesothelioma I think it would be wise to have a follow-up in 6 months with another CT scan of the chest.  Patient's agreeable to  doing so.       Hulda Marin, MD 07/13/2018, 10:48 AM

## 2018-07-13 NOTE — Patient Instructions (Signed)
CT Scan in 6 months. We will call you in 6 months.  Follow up appointment after 6 months.

## 2018-12-18 ENCOUNTER — Other Ambulatory Visit: Payer: Self-pay

## 2018-12-18 DIAGNOSIS — Z794 Long term (current) use of insulin: Principal | ICD-10-CM

## 2018-12-18 DIAGNOSIS — E119 Type 2 diabetes mellitus without complications: Secondary | ICD-10-CM

## 2019-01-15 ENCOUNTER — Other Ambulatory Visit
Admission: RE | Admit: 2019-01-15 | Discharge: 2019-01-15 | Disposition: A | Discharge status: Home / Self Care | Payer: Medicare Other | Source: Home / Self Care | Attending: Cardiothoracic Surgery | Admitting: Cardiothoracic Surgery

## 2019-01-15 ENCOUNTER — Ambulatory Visit
Admission: RE | Admit: 2019-01-15 | Discharge: 2019-01-15 | Disposition: A | Discharge status: Home / Self Care | Payer: Medicare Other | Source: Ambulatory Visit | Attending: Cardiothoracic Surgery | Admitting: Cardiothoracic Surgery

## 2019-01-15 ENCOUNTER — Other Ambulatory Visit: Payer: Self-pay

## 2019-01-15 DIAGNOSIS — Z794 Long term (current) use of insulin: Principal | ICD-10-CM

## 2019-01-15 DIAGNOSIS — J920 Pleural plaque with presence of asbestos: Secondary | ICD-10-CM | POA: Insufficient documentation

## 2019-01-15 DIAGNOSIS — E119 Type 2 diabetes mellitus without complications: Secondary | ICD-10-CM

## 2019-01-15 LAB — CREATININE, SERUM
Creatinine, Ser: 1.15 mg/dL (ref 0.61–1.24)
GFR calc Af Amer: >60 mL/min (ref >60)
GFR calc non Af Amer: >60 mL/min (ref >60)

## 2019-01-15 LAB — BUN: BUN: 23 mg/dL (ref 8–23)

## 2019-01-15 MED ORDER — IOHEXOL 300 MG/ML  SOLN
75.0000 mL | Freq: Once | INTRAMUSCULAR | Status: AC | PRN
  Administered 2019-01-15: 75 mL via INTRAVENOUS

## 2019-01-25 ENCOUNTER — Telehealth (INDEPENDENT_AMBULATORY_CARE_PROVIDER_SITE_OTHER): Payer: Medicare Other | Admitting: Cardiothoracic Surgery

## 2019-01-25 ENCOUNTER — Other Ambulatory Visit: Payer: Self-pay

## 2019-01-25 DIAGNOSIS — J920 Pleural plaque with presence of asbestos: Secondary | ICD-10-CM | POA: Diagnosis not present

## 2019-01-25 NOTE — Progress Notes (Signed)
Virtual Visit via Telephone Note  I connected with@ on 01/25/19 at  9:00 AM EDT by telephone and verified that I am speaking with the correct person using two identifiers.   I discussed the limitations, risks, security and privacy concerns of performing an evaluation and management service by telephone and the availability of in person appointments. I also discussed with the patient that there may be a patient responsible charge related to this service. The patient expressed understanding and agreed to proceed.  This service was provided via telemedicine.  The patient consented to the visit being carried via telemedicine.  Patient's location:  home  Provider's location:  office  Referring Provider:  Dr. Higinio Roger participating in this telemedicine visit:  Me, patient and wife   History of Present Illness: This patient has a history of asbestos exposure and also has a father who died of mesothelioma.  He currently works doing [employer].  He states that he has not had any shortness of breath, cough or fever.  He denied any weight loss.  He is asymptomatic.    Observations/Objective: Because this visit was conducted by telephone I was unable to perform a physical exam or take his vital signs.  However I was able to review independently his CT scan.  There are extensive bilateral pleural plaques that are stable.  There is no evidence of mesothelioma on this scan.   Assessment and Plan: I would like to see the patient back again in 1 year.  I believe that with his history of asbestos exposure and family history of mesothelioma that it would be important to do so.  He is in agreement.    I discussed the assessment and treatment plan with the patient. The patient was provided an opportunity to ask questions and all were answered. The patient agreed with the plan and demonstrated an understanding of the instructions.   The patient was advised to call back or seek an in-person evaluation if  the symptoms worsen or if the condition fails to improve as anticipated.  I provided 15 minutes of non-face-to-face time during this encounter.   Hulda Marin, MD

## 2020-01-07 ENCOUNTER — Other Ambulatory Visit: Payer: Self-pay

## 2020-01-07 DIAGNOSIS — J920 Pleural plaque with presence of asbestos: Secondary | ICD-10-CM

## 2020-01-07 DIAGNOSIS — Z77090 Contact with and (suspected) exposure to asbestos: Secondary | ICD-10-CM

## 2020-01-17 ENCOUNTER — Ambulatory Visit
Admission: RE | Admit: 2020-01-17 | Discharge: 2020-01-17 | Disposition: A | Discharge status: Home / Self Care | Payer: Medicare Other | Attending: Internal Medicine | Admitting: Internal Medicine

## 2020-01-17 ENCOUNTER — Other Ambulatory Visit: Payer: Self-pay | Admitting: Internal Medicine

## 2020-01-17 ENCOUNTER — Ambulatory Visit
Admission: RE | Admit: 2020-01-17 | Discharge: 2020-01-17 | Disposition: A | Discharge status: Home / Self Care | Payer: Medicare Other | Source: Ambulatory Visit | Attending: Internal Medicine | Admitting: Internal Medicine

## 2020-01-17 ENCOUNTER — Other Ambulatory Visit: Payer: Self-pay

## 2020-01-17 DIAGNOSIS — M545 Low back pain, unspecified: Secondary | ICD-10-CM

## 2020-01-27 ENCOUNTER — Ambulatory Visit
Admission: RE | Admit: 2020-01-27 | Discharge: 2020-01-27 | Disposition: A | Discharge status: Home / Self Care | Payer: Medicare Other | Source: Ambulatory Visit | Attending: Cardiothoracic Surgery | Admitting: Cardiothoracic Surgery

## 2020-01-27 ENCOUNTER — Other Ambulatory Visit: Payer: Self-pay

## 2020-01-27 DIAGNOSIS — Z77090 Contact with and (suspected) exposure to asbestos: Secondary | ICD-10-CM | POA: Insufficient documentation

## 2020-01-27 DIAGNOSIS — J920 Pleural plaque with presence of asbestos: Secondary | ICD-10-CM | POA: Insufficient documentation

## 2020-01-27 LAB — POCT I-STAT CREATININE: Creatinine, Ser: 1 mg/dL (ref 0.61–1.24)

## 2020-01-27 MED ORDER — IOHEXOL 300 MG/ML  SOLN
75.0000 mL | Freq: Once | INTRAMUSCULAR | Status: AC | PRN
  Administered 2020-01-27: 08:00:00 75 mL via INTRAVENOUS

## 2020-01-31 ENCOUNTER — Ambulatory Visit (INDEPENDENT_AMBULATORY_CARE_PROVIDER_SITE_OTHER): Payer: Medicare Other | Admitting: Cardiothoracic Surgery

## 2020-01-31 ENCOUNTER — Encounter: Payer: Self-pay | Admitting: Cardiothoracic Surgery

## 2020-01-31 ENCOUNTER — Other Ambulatory Visit: Payer: Self-pay

## 2020-01-31 VITALS — BP 147/78 | HR 66 | Temp 95.2°F | Ht 69.0 in | Wt 196.0 lb

## 2020-01-31 DIAGNOSIS — J920 Pleural plaque with presence of asbestos: Secondary | ICD-10-CM | POA: Diagnosis not present

## 2020-01-31 NOTE — Progress Notes (Signed)
[facility] Follow Up Note  Patient ID: Benjamin Dunn, male   DOB: 01-Jul-1951, 69 y.o.   MRN: [id number]  HISTORY: He returns today in follow-up.  He has no new complaints.  He has been followed for pleural plaques.  He has a history of exposure to asbestos.  His father died of mesothelioma.    Vitals:   01/31/20 0935  BP: (!) 147/78  Pulse: 66  Temp: (!) 95.2 F (35.1 C)  SpO2: 97%     EXAM:  Resp: Lungs are clear bilaterally.  No respiratory distress, normal effort. Heart:  Regular without murmurs Abd:  Abdomen is soft, non distended and non tender. No masses are palpable.  There is no rebound and no guarding.  Neurological: Alert and oriented to person, place, and time. Coordination normal.  Skin: Skin is warm and dry. No rash noted. No diaphoretic. No erythema. No pallor.  Psychiatric: Normal mood and affect. Normal behavior. Judgment and thought content normal.      ASSESSMENT: He did have a chest CT scan made 2 days ago which have independently reviewed.  This shows extensive bilateral pleural plaquing with some calcification of these areas.  There is no significant change compared to 1 year ago.   PLAN:   We have a 1 year follow-up documenting stability.  I would like to get another CT scan in 1 year for surveillance purposes.  He understands and would like to proceed with that.    Hulda Marin, MD

## 2020-01-31 NOTE — Patient Instructions (Signed)
Dr.Oaks discussed CT results with patient at today's visit.   Dr.Oaks recommends patient to follow up in a year and have a repeat CT scan prior to appointment.

## 2021-01-14 ENCOUNTER — Other Ambulatory Visit: Payer: Self-pay

## 2021-01-14 DIAGNOSIS — J920 Pleural plaque with presence of asbestos: Secondary | ICD-10-CM

## 2021-01-14 NOTE — Progress Notes (Signed)
CT Chest with contrast scheduled 01/29/21 arrive at 10:00 am -Liquids only 4 hours prior-Outpatient Imaging location-[address].  I let patient know Dr.Pabon will review results and be in touch,however the patient was instructed to call office the following day.

## 2021-01-29 ENCOUNTER — Ambulatory Visit
Admission: RE | Admit: 2021-01-29 | Discharge: 2021-01-29 | Disposition: A | Discharge status: Home / Self Care | Payer: Medicare Other | Source: Ambulatory Visit | Attending: Surgery | Admitting: Surgery

## 2021-01-29 ENCOUNTER — Other Ambulatory Visit: Payer: Self-pay

## 2021-01-29 DIAGNOSIS — J920 Pleural plaque with presence of asbestos: Secondary | ICD-10-CM | POA: Diagnosis not present

## 2021-01-29 HISTORY — DX: Essential (primary) hypertension: I10

## 2021-01-29 LAB — POCT I-STAT CREATININE: Creatinine, Ser: 1.1 mg/dL (ref 0.61–1.24)

## 2021-01-29 MED ORDER — IOHEXOL 300 MG/ML  SOLN
75.0000 mL | Freq: Once | INTRAMUSCULAR | Status: AC | PRN
  Administered 2021-01-29: 75 mL via INTRAVENOUS

## 2021-02-01 ENCOUNTER — Telehealth: Payer: Self-pay

## 2021-02-01 NOTE — Telephone Encounter (Signed)
Per Dr.Pabon- CT lungs consistent with asbestos and smoking changes no cancer or suspicious lesions. Patient verbalized understanding.

## 2021-02-10 ENCOUNTER — Telehealth: Payer: Self-pay | Admitting: Surgery

## 2021-02-10 NOTE — Telephone Encounter (Signed)
Patient calls back. After thinking about the results that was given to him regarding his recent CT scan, he has some additional questions and concerns.  Please call him as he needs more clarification.  Thank you.

## 2021-07-24 ENCOUNTER — Other Ambulatory Visit: Payer: Self-pay

## 2021-07-24 ENCOUNTER — Encounter: Payer: Self-pay | Admitting: Emergency Medicine

## 2021-07-24 ENCOUNTER — Ambulatory Visit
Admission: EM | Admit: 2021-07-24 | Discharge: 2021-07-24 | Disposition: A | Discharge status: Home / Self Care | Payer: Medicare Other | Attending: Physician Assistant | Admitting: Physician Assistant

## 2021-07-24 DIAGNOSIS — R1013 Epigastric pain: Secondary | ICD-10-CM

## 2021-07-24 NOTE — ED Notes (Signed)
Patient is being discharged from the Urgent Care and sent to the Emergency Department via POV . Per Beatrix Fetters, PA, patient is in need of higher level of care due to Severe epigastric pain. Patient is aware and verbalizes understanding of plan of care.  Vitals:   07/24/21 1130  BP: (!) 158/70  Pulse: 72  Resp: 16  Temp: 98.4 F (36.9 C)  SpO2: 99%

## 2021-07-24 NOTE — ED Triage Notes (Signed)
Patient c/o upper mid abdominal pain that started on Tuesday.  Patient reports vomiting once.  Patient denies N/D.  Patient reports burping.

## 2021-07-24 NOTE — ED Provider Notes (Signed)
[facility]    CSN: [id number] Arrival date & time: 07/24/21  1107      History   Chief Complaint Chief Complaint  Patient presents with   Abdominal Pain    HPI Benjamin Dunn is a 70 y.o. male who presents with epigastric pain x 4 days. Vomited x 1. Has been burping off and on. Wed woke up with pain, and Alcacerzer did not help. And vomited and that eased the pain. Then tried pepto and vomited this up. Then we made himself vomit x 3-4 times. Went back to sleep and felt better. The rest of the day he was fine. That night swelt fine Thursday had a fever  and ate bowl of oatmeal for breakfast, and lunch was light. And again 9 pm for the past 2 nights pain came back. Pain radiated though his back. Having a BM 2 nights ago seemed to help relieve some of the pain ,but did not make it resolve. Has hx of being a smoker for 30 years in the past and d/c 30 years ago. This am felt normal, and 30 minutes after eating an fried egg sandwich the pain came back and has been a 10/10, right now 8/10. Still radiating though his back.     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 Authorizing Provider  glimepiride (AMARYL) 4 MG tablet Take 4 mg by mouth 2 (two) times daily.     Yes [provider]  Insulin Glargine (BASAGLAR KWIKPEN) 100 UNIT/ML SOPN Inject 38 Units into the skin daily.   Yes [provider]  JANUVIA 100 MG tablet Take 100 mg by mouth daily. 11/30/19  Yes [provider]  lisinopril (PRINIVIL,ZESTRIL) 2.5 MG tablet Take 2.5 mg by mouth daily.  03/02/18  Yes [provider]  metFORMIN (GLUCOPHAGE) 1000 MG tablet Take 1,000 mg by mouth 2 (two) times daily with a meal.   Yes [provider]  Multiple Vitamins-Minerals (CENTRUM SILVER PO) Take by mouth daily.   Yes [provider]  omeprazole (PRILOSEC) 20 MG capsule Take 20 mg by mouth daily. 05/29/18  Yes [provider]  rosuvastatin (CRESTOR) 20 MG tablet TAKE 1 TABLET BY MOUTH IN THE EVENING 03/02/18  Yes [provider]  BD PEN NEEDLE NANO U/F 32G X 4 MM MISC USE 1 ONCE DAILY WITH BASAGLAR PEN 06/19/18   [provider]  clonazePAM (KLONOPIN) 0.5 MG tablet Take 0.5 mg by mouth 2 (two) times daily as needed for anxiety.    [provider]  co-enzyme Q-10 30 MG capsule Take 30 mg by mouth daily.    [provider]  HYDROcodone-acetaminophen (NORCO/VICODIN) 5-325 MG tablet Take 1 tablet by mouth  every 6 (six) hours as needed for moderate pain.    [provider]  methocarbamol (ROBAXIN) 500 MG tablet Take 500 mg by mouth every 6 (six) hours as needed.     [provider]  naproxen (NAPROSYN) 500 MG tablet Take 1 tablet (500 mg total) by mouth 2 (two) times daily with a meal. Patient taking differently: Take 500 mg by mouth daily.  11/08/16   Lutricia Feil, PA-C  Omega-3 Fatty Acids (FISH OIL) 1000 MG CAPS Take by mouth daily.    [provider]    Family History Family History  Problem Relation Age of Onset   Diabetes Mother    Heart attack Father    Diabetes Father    Mesothelioma Father    Colon cancer Neg Hx     Social History Social History   Tobacco Use   Smoking  status: Former    Packs/day: 3.00    Years: 12.00    Pack years: 36.00    Types: Cigarettes    Quit date: 11/08/1986    Years since quitting: 34.7   Smokeless tobacco: Never  Vaping Use   Vaping Use: Never used  Substance Use Topics   Alcohol use: Yes    Comment: occasionally   Drug use: No     Allergies   Patient has no known allergies.   Review of Systems Review of Systems  Constitutional:  Negative for chills, diaphoresis and fever.  Respiratory:  Negative for chest tightness.   Cardiovascular:  Negative for chest pain.  Gastrointestinal:  Positive for abdominal pain, nausea and vomiting. Negative for blood in stool and constipation.  Musculoskeletal:  Negative for myalgias.  Skin:  Negative for rash.    Physical Exam Triage Vital Signs ED Triage Vitals  Enc Vitals Group     BP 07/24/21 1130 (!) 158/70     Pulse Rate 07/24/21 1130 72     Resp 07/24/21 1130 16     Temp 07/24/21 1130 98.4 F (36.9 C)     Temp Source 07/24/21 1130 Oral     SpO2 07/24/21 1130 99 %     Weight 07/24/21 1128 190 lb (86.2 kg)     Height 07/24/21 1128 5\' 10"  (1.778 m)     Head Circumference --      Peak Flow --      Pain Score 07/24/21 1128 8     Pain Loc --      Pain Edu? --      Excl. in GC? --    No data found.  Updated Vital Signs BP (!) 158/70 (BP Location: Left Arm)   Pulse 72   Temp 98.4 F (36.9 C) (Oral)   Resp 16   Ht 5\' 10"  (1.778 m)   Wt 190 lb (86.2 kg)   SpO2 99%   BMI 27.26 kg/m   Visual Acuity Right Eye Distance:   Left Eye Distance:   Bilateral Distance:    Right Eye Near:   Left Eye Near:    Bilateral Near:     Physical Exam Vitals and nursing note reviewed.  Constitutional:      General: He is in acute distress.     Comments: In moderate pain  HENT:     Head: Normocephalic.  Eyes:     General: No scleral icterus. Cardiovascular:     Rate and Rhythm: Normal rate and regular rhythm.     Heart sounds: Normal heart sounds.  Pulmonary:  Effort: Pulmonary effort is normal.     Breath sounds: Normal breath sounds.  Abdominal:     General: Abdomen is protuberant. Bowel sounds are normal. There is no distension.     Palpations: Abdomen is soft. There is no hepatomegaly, splenomegaly, mass or pulsatile mass.     Tenderness: There is abdominal tenderness in the epigastric area. There is guarding. There is no rebound.     Comments: Has diastasis recti  Skin:    General: Skin is warm and dry.     Findings: No rash.  Neurological:     Mental Status: He is alert and oriented to person, place, and time.  Psychiatric:        Mood and Affect: Mood normal.        Behavior: Behavior normal.     UC Treatments / Results  Labs (all labs ordered are listed, but only abnormal results are displayed) Labs Reviewed - No data to display  EKG Normal sinus rhythm. Normal EKG  Radiology No results found.  Procedures Procedures (including critical care time)  Medications Ordered in UC Medications - No data to display  Initial Impression / Assessment and Plan / UC Course  I have reviewed the triage vital signs and the nursing notes. Pertinent CT imaging  from 2019 results that were available during my care of the patient were reviewed by me and considered in my medical decision making (see chart for details). This showed small gallstones, L renal stone and arthrosclerosis or thoracic and abdominal aorta. I explained to pt he needs to have more test than we can do here and was sent to ER.      Final Clinical Impressions(s) / UC Diagnoses   Final diagnoses:  Abdominal pain, epigastric     Discharge Instructions      Please go to the ER right now because you need more tests than what we can do here with as severe pain as you have been having.      ED Prescriptions   None    PDMP not reviewed this encounter.   Garey Ham, [place] 07/24/21 1208

## 2021-07-24 NOTE — Discharge Instructions (Signed)
Please go to the ER right now because you need more tests than what we can do here with as severe pain as you have been having.

## 2021-07-31 IMAGING — CT CT CHEST W/ CM
2 of 4 series · 15 of 36 positions shown, 18 images · IV contrast (omnipaque)
Comparison: 01/27/2020

CLINICAL DATA: Pleural plaques, asbestos exposure, current smoker

EXAM:
CT CHEST WITH CONTRAST
TECHNIQUE: Multidetector CT imaging of the chest was performed during
intravenous contrast administration.
CONTRAST:  75mL OMNIPAQUE IOHEXOL 300 MG/ML  SOLN

[Series 2: axial chest 2.00 · axial · 0.81mm/px · z∈[-1171,-897]mm · 12 of 163 slices shown, 15 images]
[im 13/163  mediastinal]
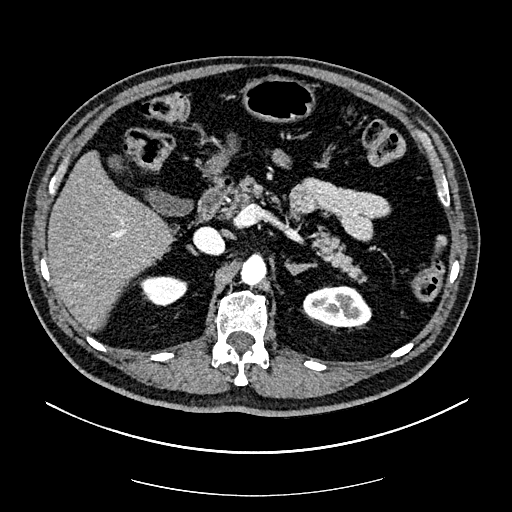
[im 13/163  lung]
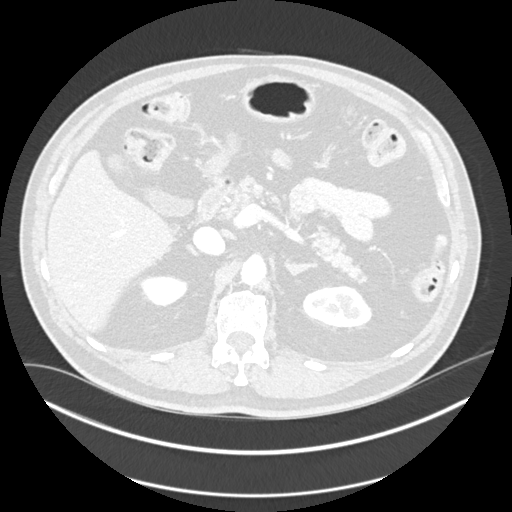
[im 25/163  lung]
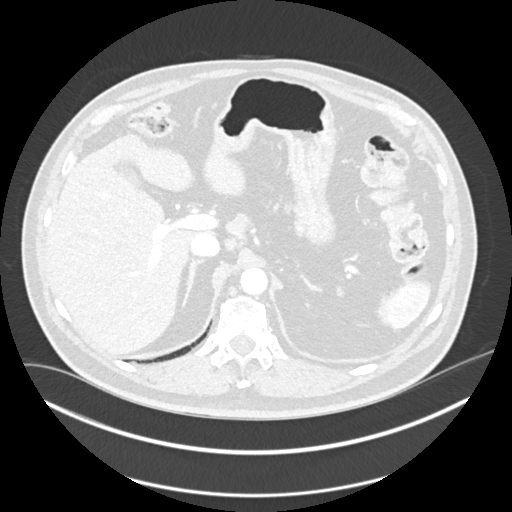
[im 38/163  lung]
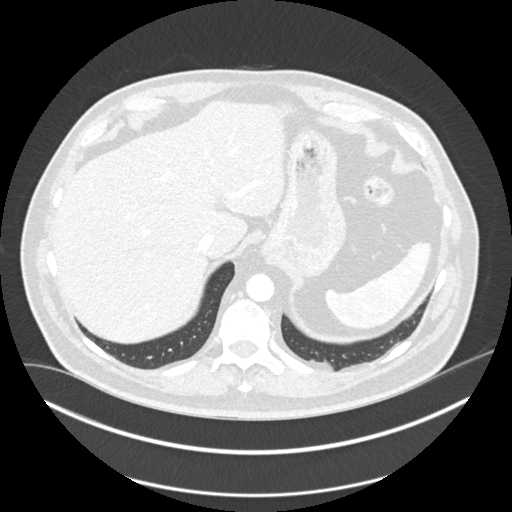
[im 50/163  lung]
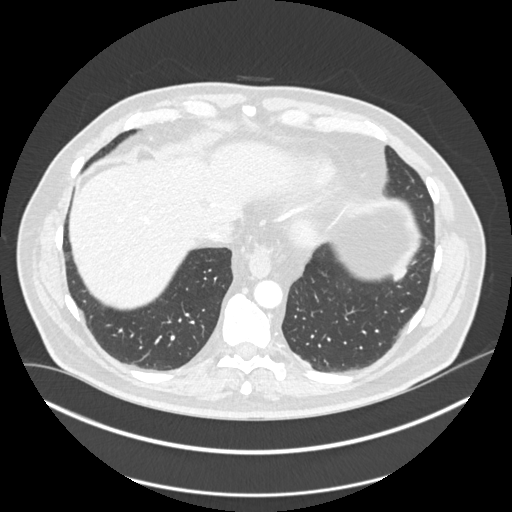
[im 63/163  mediastinal]
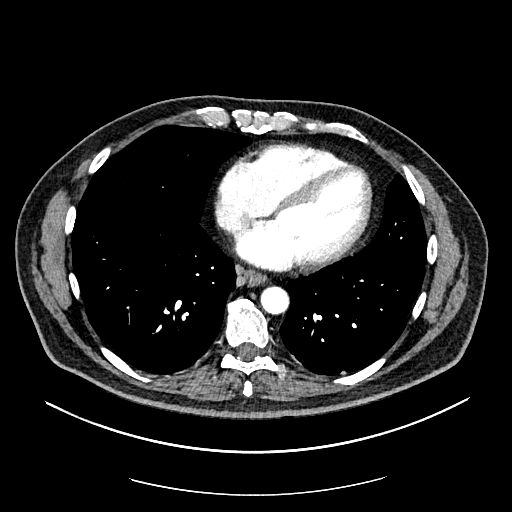
[im 63/163  lung]
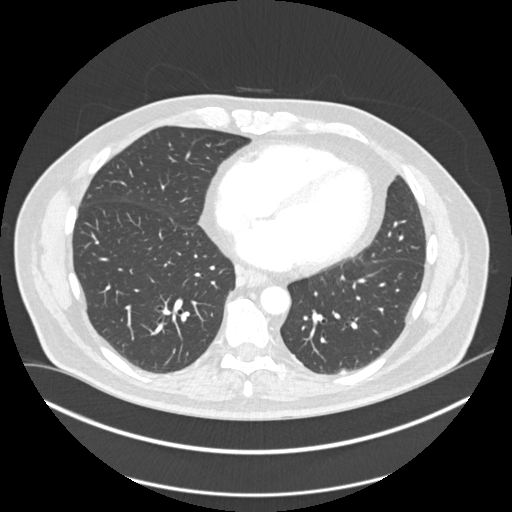
[im 75/163  lung]
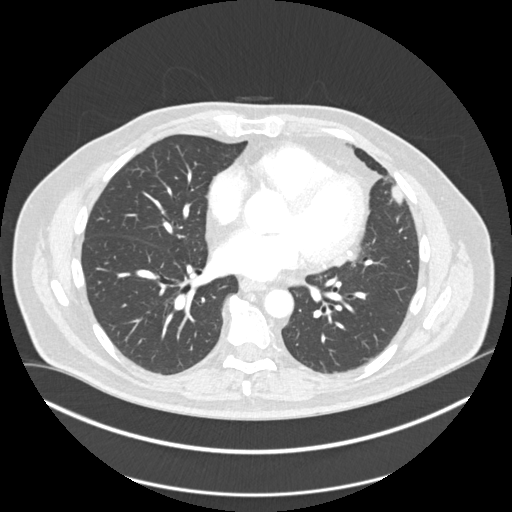
[im 88/163  lung]
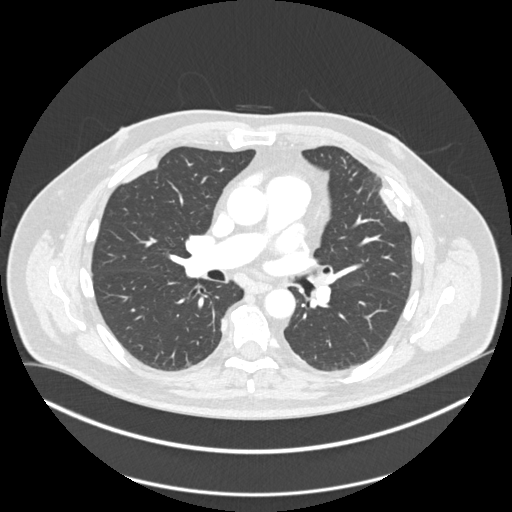
[im 100/163  lung]
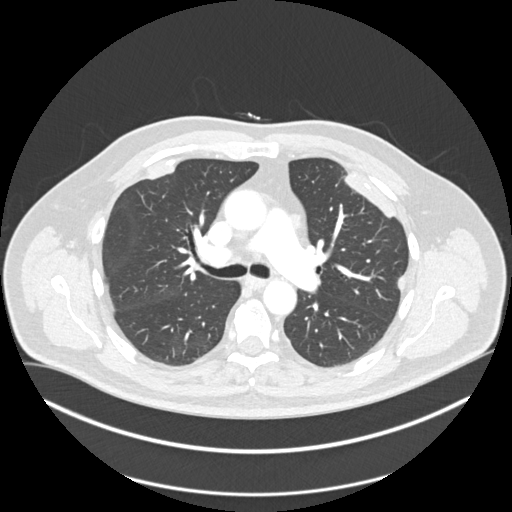
[im 113/163  mediastinal]
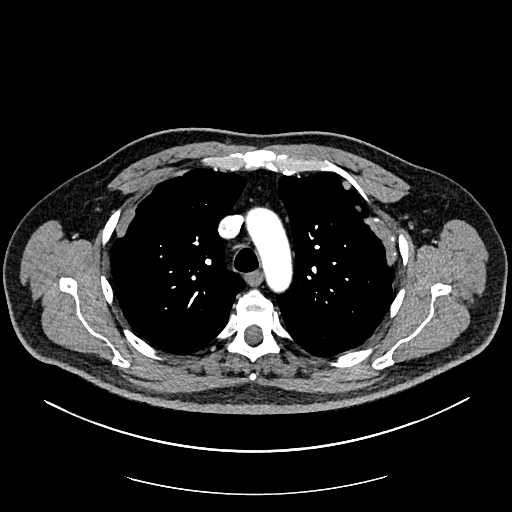
[im 113/163  lung]
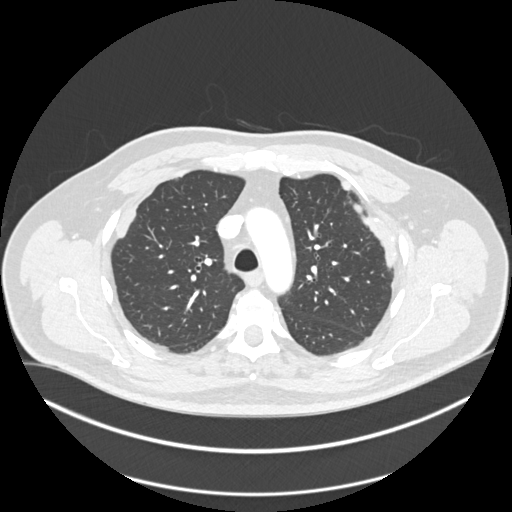
[im 125/163  lung]
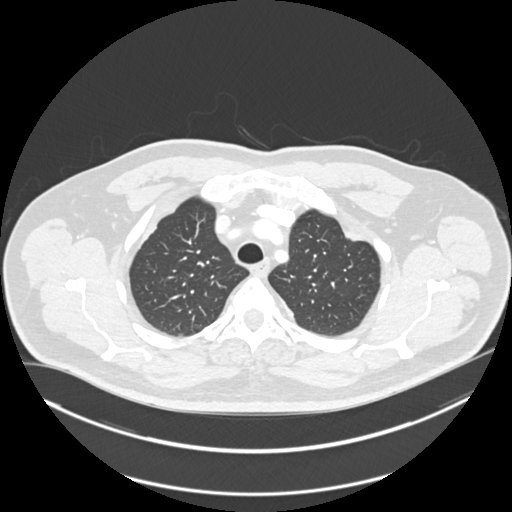
[im 138/163  lung]
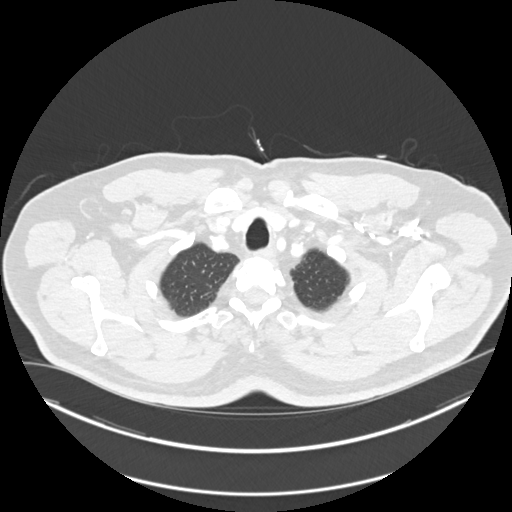
[im 150/163  lung]
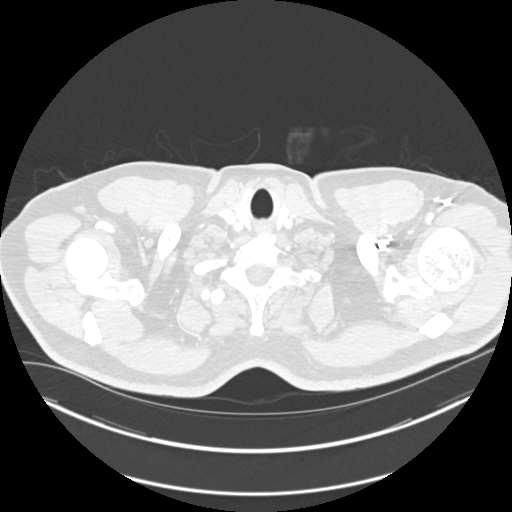

[Series 4: coronal chest 2.00 cor · coronal · 0.64mm/px · 3 of 187 slices shown]
[im 38/187  lung]
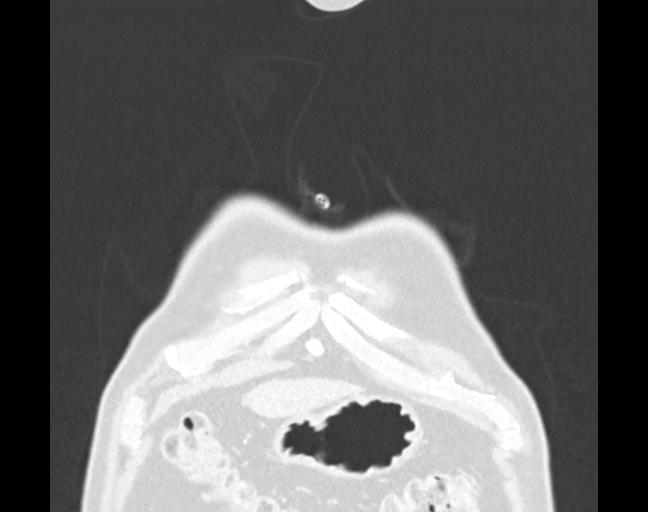
[im 75/187  lung]
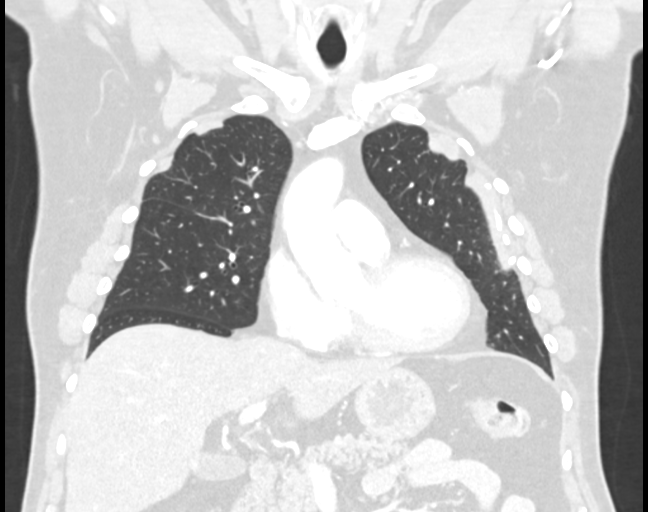
[im 112/187  lung]
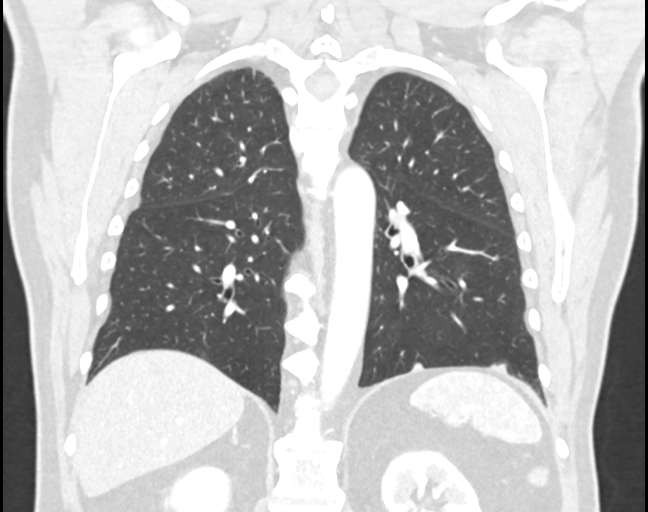

[15 of 36 positions shown; findings below may reference images not displayed]

FINDINGS: Cardiovascular: No significant vascular findings. Normal heart size.
No pericardial effusion.

Mediastinum/Nodes: No enlarged mediastinal, hilar, or axillary lymph
nodes. Thyroid gland, trachea, and esophagus demonstrate no
significant findings.

Lungs/Pleura: Fine centrilobular nodularity throughout the lungs,
most concentrated in the apices. There are thick, calcified
bilateral pleural plaques. No pleural effusion or pneumothorax.

Upper Abdomen: No acute abnormality.  Hepatic steatosis.

Musculoskeletal: No chest wall mass or suspicious bone lesions
identified.
IMPRESSION: 1. Calcified bilateral pleural plaques, in keeping with asbestos
pleural disease. No evidence of fibrotic pulmonary asbestosis.
2. Fine centrilobular nodularity throughout the lungs, most
concentrated in the apices. Findings are consistent with
smoking-related respiratory bronchiolitis.
3. Hepatic steatosis.
